# Patient Record
Sex: Male | Born: 1980 | Race: Black or African American | Hispanic: No | Marital: Married | Smoking: Current every day smoker
Health system: Southern US, Community
[De-identification: ages and names within clinical notes are randomized; demographics above are authoritative.]

---

## 2016-06-01 ENCOUNTER — Emergency Department (HOSPITAL_COMMUNITY)
Admission: EM | Admit: 2016-06-01 | Discharge: 2016-06-01 | Disposition: A | Discharge status: Home / Self Care | Payer: Self-pay | Attending: Emergency Medicine | Admitting: Emergency Medicine

## 2016-06-01 ENCOUNTER — Encounter (HOSPITAL_COMMUNITY): Payer: Self-pay

## 2016-06-01 DIAGNOSIS — R1030 Lower abdominal pain, unspecified: Secondary | ICD-10-CM | POA: Insufficient documentation

## 2016-06-01 LAB — COMPREHENSIVE METABOLIC PANEL
ALBUMIN: 4.5 g/dL (ref 3.5–5.0)
ALT: 30 U/L (ref 17–63)
AST: 29 U/L (ref 15–41)
Alkaline Phosphatase: 79 U/L (ref 38–126)
Anion gap: 8 (ref 5–15)
BILIRUBIN TOTAL: 0.6 mg/dL (ref 0.3–1.2)
BUN: 14 mg/dL (ref 6–20)
CO2: 25 mmol/L (ref 22–32)
Calcium: 9.5 mg/dL (ref 8.9–10.3)
Chloride: 105 mmol/L (ref 101–111)
Creatinine, Ser: 1.03 mg/dL (ref 0.61–1.24)
GFR calc Af Amer: >60 mL/min (ref >60)
GFR calc non Af Amer: >60 mL/min (ref >60)
GLUCOSE: 99 mg/dL (ref 65–99)
Potassium: 4.3 mmol/L (ref 3.5–5.1)
Sodium: 138 mmol/L (ref 135–145)
TOTAL PROTEIN: 7.4 g/dL (ref 6.5–8.1)

## 2016-06-01 LAB — URINALYSIS, ROUTINE W REFLEX MICROSCOPIC
BILIRUBIN URINE: NEGATIVE
Glucose, UA: NEGATIVE mg/dL
HGB URINE DIPSTICK: NEGATIVE
KETONES UR: NEGATIVE mg/dL
Leukocytes, UA: NEGATIVE
NITRITE: NEGATIVE
PH: 6 (ref 5.0–8.0)
Protein, ur: NEGATIVE mg/dL
Specific Gravity, Urine: 1.027 (ref 1.005–1.030)

## 2016-06-01 LAB — CBC
HEMATOCRIT: 44.5 % (ref 39.0–52.0)
Hemoglobin: 15.1 g/dL (ref 13.0–17.0)
MCH: 31 pg (ref 26.0–34.0)
MCHC: 33.9 g/dL (ref 30.0–36.0)
MCV: 91.4 fL (ref 78.0–100.0)
Platelets: 231 10*3/uL (ref 150–400)
RBC: 4.87 MIL/uL (ref 4.22–5.81)
RDW: 12.5 % (ref 11.5–15.5)
WBC: 6.2 10*3/uL (ref 4.0–10.5)

## 2016-06-01 LAB — LIPASE, BLOOD: Lipase: 21 U/L (ref 11–51)

## 2016-06-01 NOTE — ED Provider Notes (Signed)
[facility] Provider Note   CSN: [id number] Arrival date & time: 06/01/16  1303   By signing my name below, I, Clarisse Gouge, attest that this documentation has been prepared under the direction and in the presence of Vanetta Mulders, MD. Electronically signed, Clarisse Gouge, ED Scribe. 06/01/16. 5:11 PM.   History   Chief Complaint Chief Complaint  Patient presents with  . Abdominal Pain   The history is provided by the patient and medical records. No language interpreter was used.    Travis Garza is a 36 y.o. male with no documented pertinent PMHx, self transported via private vehicle to the Emergency Department with concern for intermittent, central abdominal pain since ~8 AM today. He states this episode lasted until ~1:15 PM today. He states this is consistent with h/o similar symptoms without known etiology. Pt notes associated nausea. Pt describes 2/10, intermittent, bilateral middle abdominal discomfort that improves over time without intervention. No other modifying factors noted. Pt denies vomiting, back pain, fevers, visual changes, chest pain, SOB, diarrhea, dysuria, leg swelling, hematuria, rash, headache or any other complaints at this time.    No past medical history on file.  There are no active problems to display for this patient.   No past surgical history on file.     Home Medications    Prior to Admission medications   Not on File    Family History No family history on file.  Social History Social History  Substance Use Topics  . Smoking status: Never Smoker  . Smokeless tobacco: Never Used  . Alcohol use Yes     Comment: occ     Allergies   Patient has no known allergies.   Review of Systems Review of Systems  Constitutional: Negative for chills and fever.  HENT: Negative for congestion, rhinorrhea and sore throat.   Eyes: Negative for visual disturbance.  Respiratory: Negative for cough and shortness of breath.     Cardiovascular: Negative for chest pain and leg swelling.  Gastrointestinal: Positive for abdominal pain and nausea. Negative for diarrhea and vomiting.  Genitourinary: Negative for dysuria and hematuria.  Musculoskeletal: Negative for back pain.  Skin: Negative for rash.  Neurological: Negative for headaches.  Hematological: Does not bruise/bleed easily.  Psychiatric/Behavioral: Negative for confusion.     Physical Exam Updated Vital Signs BP 128/72 (BP Location: Right Arm)   Pulse 70   Temp 99.1 F (37.3 C) (Oral)   Resp 19   SpO2 100%   Physical Exam  Constitutional: He appears well-developed and well-nourished. No distress.  HENT:  Head: Normocephalic.  Eyes: Conjunctivae and EOM are normal. Pupils are equal, round, and reactive to light. No scleral icterus.  Neck: Neck supple.  Cardiovascular: Normal rate and regular rhythm.   Pulmonary/Chest: Effort normal. No respiratory distress. He has no wheezes. He has no rales.  Abdominal: Soft. Bowel sounds are normal. There is no tenderness.  Musculoskeletal: Normal range of motion. He exhibits no edema.  Neurological: He is alert. No cranial nerve deficit or sensory deficit. He exhibits normal muscle tone. Coordination normal.  Skin: Skin is warm and dry.  Psychiatric: He has a normal mood and affect.  Nursing note and vitals reviewed.    ED Treatments / Results  DIAGNOSTIC STUDIES: Oxygen Saturation is 100% on RA, NL by my interpretation.    COORDINATION OF CARE: 5:10 PM Discussed treatment plan with pt at bedside and pt agreed to plan. Will order labs. Labs (all labs ordered are listed, but  only abnormal results are displayed) Labs Reviewed  LIPASE, BLOOD  COMPREHENSIVE METABOLIC PANEL  CBC  URINALYSIS, ROUTINE W REFLEX MICROSCOPIC    EKG  EKG Interpretation None       Radiology No results found.  Procedures Procedures (including critical care time)  Medications Ordered in ED Medications - No data  to display   Initial Impression / Assessment and Plan / ED Course  I have reviewed the triage vital signs and the nursing notes.  Pertinent labs & imaging results that were available during my care of the patient were reviewed by me and considered in my medical decision making (see chart for details).     Abdominal pain has resolved and not returned. Patient feels fine now. Labs without sniffing abnormalities. Urinalysis negative. No reason for her CT imaging at this time. Patient will return for recurrent abdominal pain lasting an hour or longer. Patient stable for discharge home.  Final Clinical Impressions(s) / ED Diagnoses   Final diagnoses:  Lower abdominal pain    New Prescriptions New Prescriptions   No medications on file    I personally performed the services described in this documentation, which was scribed in my presence. The recorded information has been reviewed and is accurate.      Vanetta Mulders, MD 06/01/16 1734

## 2016-06-01 NOTE — ED Notes (Signed)
ED Provider at bedside. 

## 2016-06-01 NOTE — Discharge Instructions (Signed)
Workup for the abdominal pain without any significant findings. Labs all normal. Return for recurrent pain that lasts for more than an hour. Work note provided.

## 2016-06-01 NOTE — ED Triage Notes (Signed)
Pt states he had abdominal pain at work with some nausea. Denies vomiting. Pt reports the pain is intermittent in nature.

## 2016-10-16 ENCOUNTER — Encounter (HOSPITAL_COMMUNITY): Payer: Self-pay | Admitting: *Deleted

## 2016-10-16 ENCOUNTER — Ambulatory Visit (HOSPITAL_COMMUNITY)
Admission: EM | Admit: 2016-10-16 | Discharge: 2016-10-16 | Disposition: A | Discharge status: Home / Self Care | Payer: Self-pay | Attending: Family Medicine | Admitting: Family Medicine

## 2016-10-16 DIAGNOSIS — R059 Cough, unspecified: Secondary | ICD-10-CM

## 2016-10-16 DIAGNOSIS — J40 Bronchitis, not specified as acute or chronic: Secondary | ICD-10-CM

## 2016-10-16 DIAGNOSIS — R05 Cough: Secondary | ICD-10-CM

## 2016-10-16 MED ORDER — BENZONATATE 100 MG PO CAPS: 200.0000 mg | ORAL_CAPSULE | Freq: Three times a day (TID) | ORAL | 0 refills | Status: DC | PRN

## 2016-10-16 MED ORDER — IPRATROPIUM BROMIDE 0.06 % NA SOLN: 2.0000 | Freq: Four times a day (QID) | NASAL | 0 refills | Status: DC

## 2016-10-16 MED ORDER — AZITHROMYCIN 250 MG PO TABS: 250.0000 mg | ORAL_TABLET | Freq: Every day | ORAL | 0 refills | Status: DC

## 2016-10-16 NOTE — ED Provider Notes (Signed)
  [facility]   [id number] 10/16/16 Arrival Time: 1735  ASSESSMENT & PLAN:  1. Bronchitis   2. Cough     Meds ordered this encounter  Medications  . azithromycin (ZITHROMAX) 250 MG tablet    Sig: Take 1 tablet (250 mg total) by mouth daily. Take first 2 tablets together, then 1 every day until finished.    Dispense:  6 tablet    Refill:  0    Order Specific Question:   Supervising Provider    Answer:   Mardella LaymanHAGLER, BRIAN [id number]]  . benzonatate (TESSALON) 100 MG capsule    Sig: Take 2 capsules (200 mg total) by mouth 3 (three) times daily as needed for cough.    Dispense:  21 capsule    Refill:  0    Order Specific Question:   Supervising Provider    Answer:   Mardella LaymanHAGLER, BRIAN [id number]]  . ipratropium (ATROVENT) 0.06 % nasal spray    Sig: Place 2 sprays into both nostrils 4 (four) times daily.    Dispense:  15 mL    Refill:  0    Order Specific Question:   Supervising Provider    Answer:   Mardella LaymanHAGLER, BRIAN [[id number]]    Reviewed expectations re: course of current medical issues. Questions answered. Outlined signs and symptoms indicating need for more acute intervention. Patient verbalized understanding. After Visit Summary given.   SUBJECTIVE:  Travis Garza is a 36 y.o. male who presents with complaint of nasal congestion and cough for last few days.  He is a smoker.  ROS: As per HPI.   OBJECTIVE:  Vitals:   10/16/16 1816  BP: 129/85  Pulse: 77  Resp: 17  Temp: 98.5 F (36.9 C)  TempSrc: Oral  SpO2: 100%    General appearance: alert; no distress Eyes: PERRLA; EOMI; conjunctiva normal HENT: normocephalic; atraumatic; TMs normal; nasal mucosa normal; oral mucosa normal Neck: supple Lungs: clear to auscultation bilaterally Heart: regular rate and rhythm Abdomen: soft, non-tender; bowel sounds normal; no masses or organomegaly; no guarding or rebound tenderness Back: no CVA tenderness Extremities: no cyanosis or edema; symmetrical with no gross  deformities Skin: warm and dry Neurologic: normal gait; normal symmetric reflexes Psychological: alert and cooperative; normal mood and affect  Labs:  Labs Reviewed - No data to display  Imaging: No results found.  No Known Allergies  History reviewed. No pertinent past medical history. Social History   Social History  . Marital status: Married    Spouse name: N/A  . Number of children: N/A  . Years of education: N/A   Occupational History  . Not on file.   Social History Main Topics  . Smoking status: Current Every Day Smoker  . Smokeless tobacco: Never Used  . Alcohol use Yes     Comment: occ  . Drug use: Unknown  . Sexual activity: Not on file   Other Topics Concern  . Not on file   Social History Narrative  . No narrative on file   History reviewed. No pertinent family history. History reviewed. No pertinent surgical history.   Deatra CanterOxford, William J, [place]FNP 10/16/16 1843

## 2016-10-16 NOTE — ED Triage Notes (Signed)
Patient reports nasal drainage and cough since yesterday. No fever.

## 2016-10-24 ENCOUNTER — Encounter (HOSPITAL_COMMUNITY): Payer: Self-pay | Admitting: Emergency Medicine

## 2016-10-24 ENCOUNTER — Ambulatory Visit (HOSPITAL_COMMUNITY)
Admission: EM | Admit: 2016-10-24 | Discharge: 2016-10-24 | Disposition: A | Discharge status: Home / Self Care | Payer: Self-pay | Attending: Family Medicine | Admitting: Family Medicine

## 2016-10-24 DIAGNOSIS — M791 Myalgia, unspecified site: Secondary | ICD-10-CM

## 2016-10-24 MED ORDER — IBUPROFEN 800 MG PO TABS: 800.0000 mg | ORAL_TABLET | Freq: Three times a day (TID) | ORAL | 0 refills | Status: DC

## 2016-10-24 NOTE — ED Triage Notes (Signed)
Pt c/o right side sharp should pain onset about a week ago. Pt denies injury but he does do some heavy lifting for his job. Pt also states he goes to the gym and lifts weights.

## 2016-10-24 NOTE — ED Provider Notes (Signed)
[facility]    CSN: [id number] Arrival date & time: 10/24/16  1602     History   Chief Complaint Chief Complaint  Patient presents with  . Shoulder Pain    HPI Travis Garza is a 36 y.o. male.   Presents today for sharp pain to right shoulder area for 1 week, not getting worse or better. Pain is constant. Pain does not radiate.  No aggravating or alleviating factors. Have not tried anything to help at home. No numbness or tingling sensation. Denies neck pain. Has no known injury. He does lift heavy object at work.          History reviewed. No pertinent past medical history.  There are no active problems to display for this patient.   History reviewed. No pertinent surgical history.     Home Medications    Prior to Admission medications   Medication Sig Start Date End Date Taking? Authorizing Provider  azithromycin (ZITHROMAX) 250 MG tablet Take 1 tablet (250 mg total) by mouth daily. Take first 2 tablets together, then 1 every day until finished. 10/16/16   Deatra Canter, FNP  benzonatate (TESSALON) 100 MG capsule Take 2 capsules (200 mg total) by mouth 3 (three) times daily as needed for cough. 10/16/16   Deatra Canter, FNP  ibuprofen (ADVIL,MOTRIN) 800 MG tablet Take 1 tablet (800 mg total) by mouth 3 (three) times daily. 10/24/16   Lucia Estelle, NP  ipratropium (ATROVENT) 0.06 % nasal spray Place 2 sprays into both nostrils 4 (four) times daily. 10/16/16   Deatra Canter, FNP    Family History History reviewed. No pertinent family history.  Social History Social History  Substance Use Topics  . Smoking status: Current Every Day Smoker    Packs/day: 1.00  . Smokeless tobacco: Never Used  . Alcohol use Yes     Comment: occ     Allergies   Patient has no known allergies.   Review of Systems Review of Systems  Constitutional:       See HPI      Physical Exam Triage Vital Signs ED Triage Vitals  Enc Vitals Group     BP --       Pulse Rate 10/24/16 1640 72     Resp 10/24/16 1640 16     Temp 10/24/16 1640 98.9 F (37.2 C)     Temp Source 10/24/16 1640 Oral     SpO2 10/24/16 1640 97 %     Weight --      Height --      Head Circumference --      Peak Flow --      Pain Score 10/24/16 1642 5     Pain Loc --      Pain Edu? --      Excl. in GC? --    No data found.   Updated Vital Signs Pulse 72   Temp 98.9 F (37.2 C) (Oral)   Resp 16   SpO2 97%   Visual Acuity Right Eye Distance:   Left Eye Distance:   Bilateral Distance:    Right Eye Near:   Left Eye Near:    Bilateral Near:     Physical Exam  Constitutional: He is oriented to person, place, and time. He appears well-developed and well-nourished.  HENT:  Head: Normocephalic and atraumatic.  Cardiovascular: Normal rate, regular rhythm and normal heart sounds.   Pulmonary/Chest: Effort normal and breath sounds normal. He has no wheezes.  Musculoskeletal: Normal range of motion. He exhibits tenderness (Tender on deep palpation over right shoulder area. ). He exhibits no edema or deformity.       Arms: Neurological: He is alert and oriented to person, place, and time.  Nursing note and vitals reviewed.    UC Treatments / Results  Labs (all labs ordered are listed, but only abnormal results are displayed) Labs Reviewed - No data to display  EKG  EKG Interpretation None       Radiology No results found.  Procedures Procedures (including critical care time)  Medications Ordered in UC Medications - No data to display   Initial Impression / Assessment and Plan / UC Course  I have reviewed the triage vital signs and the nursing notes.  Pertinent labs & imaging results that were available during my care of the patient were reviewed by me and considered in my medical decision making (see chart for details).     Final Clinical Impressions(s) / UC Diagnoses   Final diagnoses:  Muscle pain   1) Start NSAID TID PRN 2) Start  Apply heat to the area 3) May try to massage the area 4) Rest the area if possible 5) Work note given for today.   New Prescriptions New Prescriptions   IBUPROFEN (ADVIL,MOTRIN) 800 MG TABLET    Take 1 tablet (800 mg total) by mouth 3 (three) times daily.     Controlled Substance Prescriptions [place] Controlled Substance Registry consulted? Not Applicable   Lucia EstelleZheng, Milton Streicher, NP 10/24/16 [phone]

## 2017-10-04 ENCOUNTER — Emergency Department (HOSPITAL_COMMUNITY): Payer: Self-pay

## 2017-10-04 ENCOUNTER — Emergency Department (HOSPITAL_COMMUNITY)
Admission: EM | Admit: 2017-10-04 | Discharge: 2017-10-04 | Disposition: A | Discharge status: Home / Self Care | Payer: Self-pay | Attending: Emergency Medicine | Admitting: Emergency Medicine

## 2017-10-04 ENCOUNTER — Encounter (HOSPITAL_COMMUNITY): Payer: Self-pay | Admitting: Emergency Medicine

## 2017-10-04 ENCOUNTER — Other Ambulatory Visit: Payer: Self-pay

## 2017-10-04 DIAGNOSIS — M25511 Pain in right shoulder: Secondary | ICD-10-CM | POA: Insufficient documentation

## 2017-10-04 DIAGNOSIS — F17200 Nicotine dependence, unspecified, uncomplicated: Secondary | ICD-10-CM | POA: Insufficient documentation

## 2017-10-04 MED ORDER — METHOCARBAMOL 500 MG PO TABS: 500.0000 mg | ORAL_TABLET | Freq: Three times a day (TID) | ORAL | 0 refills | Status: DC | PRN

## 2017-10-04 MED ORDER — NAPROXEN 500 MG PO TABS: 500.0000 mg | ORAL_TABLET | Freq: Two times a day (BID) | ORAL | 0 refills | Status: DC

## 2017-10-04 NOTE — Discharge Instructions (Addendum)
Please read and follow all provided instructions.  You have been seen today for right shoulder pain  Tests performed today include: An x-ray of the affected area - does NOT show any broken bones or dislocations.  Vital signs. See below for your results today.   Medications:  - Naproxen is a nonsteroidal anti-inflammatory medication that will help with pain and swelling. Be sure to take this medication as prescribed with food, 1 pill every 12 hours,  It should be taken with food, as it can cause stomach upset, and more seriously, stomach bleeding. Do not take other nonsteroidal anti-inflammatory medications with this such as Advil, Motrin, Aleve, Mobic, Goodie Powder, or Motrin.    - Robaxin is the muscle relaxer I have prescribed, this is meant to help with muscle tightness. Be aware that this medication may make you drowsy therefore the first time you take this it should be at a time you are in an environment where you can rest. Do not drive or operate heavy machinery when taking this medication. Do not drink alcohol or take other sedating medications with this medicine such as narcotics or benzodiazepines.   You make take Tylenol per over the counter dosing with these medications.   We have prescribed you new medication(s) today. Discuss the medications prescribed today with your pharmacist as they can have adverse effects and interactions with your other medicines including over the counter and prescribed medications. Seek medical evaluation if you start to experience new or abnormal symptoms after taking one of these medicines, seek care immediately if you start to experience difficulty breathing, feeling of your throat closing, facial swelling, or rash as these could be indications of a more serious allergic reaction  Follow-up instructions: Please follow-up with your primary care provider or the provided orthopedic physician (bone specialist) if you continue to have significant pain in 1  week. In this case you may have a more severe injury that requires further care.   Return instructions:  Please return if your digits or extremity are numb or tingling, appear gray or blue, or you have severe pain (also elevate the extremity and loosen splint or wrap if you were given one) Please return if you have redness or fevers.  Please return to the Emergency Department if you experience worsening symptoms.  Please return if you have any other emergent concerns. Additional Information:  Your vital signs today were: BP (!) 124/99 (BP Location: Left Arm)    Pulse 74    Temp 98 F (36.7 C) (Oral)    Resp 18    SpO2 97%  If your blood pressure (BP) was elevated above 135/85 this visit, please have this repeated by your doctor within one month. ---------------

## 2017-10-04 NOTE — ED Triage Notes (Signed)
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 VSS. Pt in NAD.

## 2017-10-04 NOTE — ED Provider Notes (Signed)
[facility]   CSN: [id number] Arrival date & time: 10/04/17  0750     History   Chief Complaint Chief Complaint  Patient presents with  . Shoulder Pain    HPI Travis Garza is a 37 y.o. male with a hx of tobacco abuse who presents to the ED with complaints of R shoulder pain that started 2 days ago. 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 Pain is mostly in the posterior shoulder, described as sharp, an 8/10 in severity, worse with movement. No alleviating factors, no interventions PTA. Denies fever, chills, numbness, weakness, paresthesias, neck pain, chest pain, or dyspnea.   HPI  History reviewed. No pertinent past medical history.  There are no active problems to display for this patient.   History reviewed. No pertinent surgical history.      Home Medications    Prior to Admission medications   Medication Sig Start Date End Date Taking? Authorizing Provider  azithromycin (ZITHROMAX) 250 MG tablet Take 1 tablet (250 mg total) by mouth daily. Take first 2 tablets together, then 1 every day until finished. 10/16/16   Deatra Canterxford, William J, FNP  benzonatate (TESSALON) 100 MG capsule Take 2 capsules (200 mg total) by mouth 3 (three) times daily as needed for cough. 10/16/16   Deatra Canterxford, William J, FNP  ibuprofen (ADVIL,MOTRIN) 800 MG tablet Take 1 tablet (800 mg total) by mouth 3 (three) times daily. 10/24/16   Lucia EstelleZheng, Feng, NP  ipratropium (ATROVENT) 0.06 % nasal spray Place 2 sprays into both nostrils 4 (four) times daily. 10/16/16   Deatra Canterxford, William J, FNP    Family History History reviewed. No pertinent family history.  Social History Social History   Tobacco Use  . Smoking status: Current Every Day Smoker    Packs/day: 1.00  . Smokeless tobacco: Never Used  Substance Use Topics  . Alcohol use:  Yes    Comment: occ  . Drug use: Not on file     Allergies   Patient has no known allergies.   Review of Systems Review of Systems  Constitutional: Negative for chills and fever.  Musculoskeletal: Positive for arthralgias (R shoulder). Negative for neck pain.  Neurological: Negative for weakness and numbness.     Physical Exam Updated Vital Signs BP (!) 124/99 (BP Location: Left Arm)   Pulse 74   Temp 98 F (36.7 C) (Oral)   Resp 18   SpO2 97%   Physical Exam  Constitutional: He appears well-developed and well-nourished. No distress.  HENT:  Head: Normocephalic and atraumatic.  Eyes: Conjunctivae are normal. Right eye exhibits no discharge. Left eye exhibits no discharge.  Neck: Normal range of motion. No spinous process tenderness present.  Cardiovascular:  2+ symmetric radial pulses  Musculoskeletal:  No obvious deformity, appreciable swelling, erythema, warmth, open wounds, or ecchymosis.  Upper extremities: Patient has full ROM to bilateral shoulders, elbows, and wrists. He has some discomfort with RUE shoulder flexion, but is able to perform. He is tender over the supraspinatus and trapezius on the right. Palpable muscle spasm. No point/focal bony tenderness or palpable joint instability. Empty can test with good strength.   Neurological: He is alert.  Clear speech. Sensation grossly intact to bilateral upper extremities. 5/5 symmetric grip strength. Able to perform OK sign, thumbs up, and cross 2nd/3rd digits bilaterally.  Psychiatric: He has a normal mood and affect. His behavior is normal. Thought content normal.  Nursing note and vitals reviewed.    ED Treatments / Results  Labs (all labs ordered are listed, but only abnormal results are displayed) Labs Reviewed - No data to display  EKG None  Radiology Dg Shoulder Right  Result Date: 10/04/2017 CLINICAL DATA:  Sharp right shoulder pain with painful range of motion. EXAM: RIGHT SHOULDER - 2+ VIEW  COMPARISON:  None. FINDINGS: There is no evidence of fracture or dislocation. There is no evidence of arthropathy or other focal bone abnormality. Soft tissues are unremarkable. IMPRESSION: Negative. Electronically Signed   By: Francene BoyersJames  Maxwell M.D.   On: 10/04/2017 08:41    Procedures Procedures (including critical care time)  Medications Ordered in ED Medications - No data to display   Initial Impression / Assessment and Plan / ED Course  I have reviewed the triage vital signs and the nursing notes.  Pertinent labs & imaging results that were available during my care of the patient were reviewed by me and considered in my medical decision making (see chart for details).   Patient presents to the ED with complaints of R shoulder pain s/p operating machinery/heavy lifting at work. Patient nontoxic appearing, in no apparent distress, vitals WNL with the exception of elevated diastolic BP- doubt HTN emergency, patient aware of need for recheck. X-ray negative for fracture/dislocation. No fevers, erythema, or warmth to raise concern for infectious etiology such as septic joint. Pain reproducible with palpable muscle spasm in the R trapezius/supraspinatus area. NVI distally. Will treat with naproxen/robaxin- instructed patient not to drive/operate heavy machinery when taking robaxin. I discussed results, treatment plan, need for PCP/ortho follow-up, and return precautions with the patient. Provided opportunity for questions, patient confirmed understanding and is in agreement with plan.    Final Clinical Impressions(s) / ED Diagnoses   Final diagnoses:  Acute pain of right shoulder    ED Discharge Orders         Ordered    naproxen (NAPROSYN) 500 MG tablet  2 times daily     10/04/17 0903    methocarbamol (ROBAXIN) 500 MG tablet  Every 8 hours PRN     10/04/17 0903           Cherly Andersonetrucelli, Harmon Bommarito R, PA-C 10/04/17 0905    Wynetta FinesMessick, Peter C, MD 10/05/17 2021

## 2018-05-20 ENCOUNTER — Other Ambulatory Visit: Payer: Self-pay

## 2018-05-20 ENCOUNTER — Emergency Department (HOSPITAL_COMMUNITY)
Admission: EM | Admit: 2018-05-20 | Discharge: 2018-05-20 | Disposition: A | Discharge status: Home / Self Care | Payer: Self-pay | Attending: Emergency Medicine | Admitting: Emergency Medicine

## 2018-05-20 ENCOUNTER — Encounter (HOSPITAL_COMMUNITY): Payer: Self-pay | Admitting: Emergency Medicine

## 2018-05-20 DIAGNOSIS — F17200 Nicotine dependence, unspecified, uncomplicated: Secondary | ICD-10-CM | POA: Insufficient documentation

## 2018-05-20 DIAGNOSIS — K0889 Other specified disorders of teeth and supporting structures: Secondary | ICD-10-CM

## 2018-05-20 DIAGNOSIS — K029 Dental caries, unspecified: Secondary | ICD-10-CM | POA: Insufficient documentation

## 2018-05-20 DIAGNOSIS — Z79899 Other long term (current) drug therapy: Secondary | ICD-10-CM | POA: Insufficient documentation

## 2018-05-20 MED ORDER — PENICILLIN V POTASSIUM 500 MG PO TABS: 500.0000 mg | ORAL_TABLET | Freq: Four times a day (QID) | ORAL | 0 refills | Status: AC

## 2018-05-20 NOTE — Discharge Instructions (Addendum)
Take penicillin as prescribed and complete the full course. Take 2 Advil liquid gels with 1 extra strength Tylenol every 6 hours for pain. Rinse with Listerine after every meal. See a dentist as soon as possible.

## 2018-05-20 NOTE — ED Provider Notes (Signed)
[facility] EMERGENCY DEPARTMENT Provider Note   CSN: [id number] Arrival date & time: 05/20/18  1027    History   Chief Complaint Chief Complaint  Patient presents with  . Dental Pain    HPI JAMESMATTHEW RAPPOLD is a 38 y.o. male.     38 year old male presents with complaint of right upper central incisor pain x3 days without trauma, fever, drainage.  States the tooth is falling out.  Patient is not taking anything for his pain.  Reports mild facial swelling.  No other complaints or concerns.  BRANSON HANNULA was evaluated in Emergency Department on 05/20/2018 for the symptoms described in the history of present illness. He was evaluated in the context of the global COVID-19 pandemic, which necessitated consideration that the patient might be at risk for infection with the SARS-CoV-2 virus that causes COVID-19. Institutional protocols and algorithms that pertain to the evaluation of patients at risk for COVID-19 are in a state of rapid change based on information released by regulatory bodies including the CDC and federal and state organizations. These policies and algorithms were followed during the patient's care in the ED.      History reviewed. No pertinent past medical history.  There are no active problems to display for this patient.   History reviewed. No pertinent surgical history.      Home Medications    Prior to Admission medications   Medication Sig Start Date End Date Taking? Authorizing Provider  ipratropium (ATROVENT) 0.06 % nasal spray Place 2 sprays into both nostrils 4 (four) times daily. 10/16/16   Deatra Canter, FNP  methocarbamol (ROBAXIN) 500 MG tablet Take 1 tablet (500 mg total) by mouth every 8 (eight) hours as needed. 10/04/17   Petrucelli, Samantha R, PA-C  penicillin v potassium (VEETID) 500 MG tablet Take 1 tablet (500 mg total) by mouth 4 (four) times daily for 7 days. 05/20/18 05/27/18  Jeannie Fend, PA-C    Family History  No family history on file.  Social History Social History   Tobacco Use  . Smoking status: Current Every Day Smoker    Packs/day: 1.00  . Smokeless tobacco: Never Used  Substance Use Topics  . Alcohol use: Yes    Comment: occ  . Drug use: Never     Allergies   Patient has no known allergies.   Review of Systems Review of Systems  Constitutional: Negative for fever.  HENT: Positive for dental problem and facial swelling. Negative for ear pain, trouble swallowing and voice change.   Gastrointestinal: Negative for nausea and vomiting.  Musculoskeletal: Negative for neck pain.  Skin: Negative for rash and wound.  Allergic/Immunologic: Negative for immunocompromised state.  Neurological: Negative for headaches.  Hematological: Negative for adenopathy.  Psychiatric/Behavioral: Negative for confusion.  All other systems reviewed and are negative.    Physical Exam Updated Vital Signs BP (!) 134/96 (BP Location: Right Arm)   Pulse 77   Temp 98.5 F (36.9 C) (Oral)   Resp 16   Ht 6\' 1"  (1.854 m)   Wt 102.1 kg   SpO2 100%   BMI 29.69 kg/m   Physical Exam Vitals signs and nursing note reviewed.  Constitutional:      General: He is not in acute distress.    Appearance: He is well-developed. He is not diaphoretic.  HENT:     Head: Normocephalic and atraumatic.     Jaw: No trismus.     Mouth/Throat:     Mouth:  Mucous membranes are moist.   Neck:     Musculoskeletal: Neck supple.  Pulmonary:     Effort: Pulmonary effort is normal.  Lymphadenopathy:     Cervical: No cervical adenopathy.  Neurological:     Mental Status: He is alert and oriented to person, place, and time.  Psychiatric:        Behavior: Behavior normal.      ED Treatments / Results  Labs (all labs ordered are listed, but only abnormal results are displayed) Labs Reviewed - No data to display  EKG None  Radiology No results found.  Procedures Procedures (including critical care time)   Medications Ordered in ED Medications - No data to display   Initial Impression / Assessment and Plan / ED Course  I have reviewed the triage vital signs and the nursing notes.  Pertinent labs & imaging results that were available during my care of the patient were reviewed by me and considered in my medical decision making (see chart for details).  Clinical Course as of May 20 854  Fri May 20, 2018  4128 38 year old male with right upper dental pain with decay without trauma, fever, drainage.  Patient given prescription for penicillin, advised to take Motrin Tylenol and follow-up with a dentist as soon as possible.   [LM]    Clinical Course User Index [LM] Jeannie Fend, PA-C      Final Clinical Impressions(s) / ED Diagnoses   Final diagnoses:  Pain, dental  Dental caries    ED Discharge Orders         Ordered    penicillin v potassium (VEETID) 500 MG tablet  4 times daily     05/20/18 0854           Jeannie Fend, PA-C 05/20/18 3009    Margarita Grizzle, MD 05/27/18 [phone]

## 2018-05-20 NOTE — ED Triage Notes (Addendum)
Pt reports a toothache that has been going for the past 3 days. Pt denies attempting the usage of OTC meds. Reports coming to the ER was an easier option.

## 2018-07-29 ENCOUNTER — Encounter (HOSPITAL_COMMUNITY): Payer: Self-pay | Admitting: Emergency Medicine

## 2018-07-29 ENCOUNTER — Other Ambulatory Visit: Payer: Self-pay

## 2018-07-29 ENCOUNTER — Ambulatory Visit (HOSPITAL_COMMUNITY)
Admission: EM | Admit: 2018-07-29 | Discharge: 2018-07-29 | Disposition: A | Discharge status: Home / Self Care | Payer: Self-pay | Attending: Family Medicine | Admitting: Family Medicine

## 2018-07-29 DIAGNOSIS — G44209 Tension-type headache, unspecified, not intractable: Secondary | ICD-10-CM

## 2018-07-29 MED ORDER — KETOROLAC TROMETHAMINE 30 MG/ML IJ SOLN
30.0000 mg | Freq: Once | INTRAMUSCULAR | Status: AC
  Administered 2018-07-29: 13:00:00 30 mg via INTRAMUSCULAR

## 2018-07-29 MED ORDER — KETOROLAC TROMETHAMINE 30 MG/ML IJ SOLN
INTRAMUSCULAR | Status: AC
  Filled 2018-07-29: qty 1

## 2018-07-29 NOTE — ED Triage Notes (Signed)
Pt here for left sided HA starting this am

## 2018-07-29 NOTE — ED Provider Notes (Signed)
[place]    CSN: [id number] Arrival date & time: 07/29/18  1210     History   Chief Complaint Chief Complaint  Patient presents with  . Headache    HPI Travis Garza is a 38 y.o. male.   Patient is a 38 year old male who presents today with left temporal headache.  This woke him up at 2 AM.  Since his pain has decreased.  He has not taken anything for the pain.  Nothing makes the pain worse or better.  Denies any history of migraines or headaches.  Denies any associated photophobia, phonophobia, nausea, vomiting, blurred vision, dizziness, or weakness.  Denies any head injuries or sinus issues.  ROS per HPI      History reviewed. No pertinent past medical history.  There are no active problems to display for this patient.   History reviewed. No pertinent surgical history.     Home Medications    Prior to Admission medications   Medication Sig Start Date End Date Taking? Authorizing Provider  ipratropium (ATROVENT) 0.06 % nasal spray Place 2 sprays into both nostrils 4 (four) times daily. Patient not taking: Reported on 07/29/2018 10/16/16 07/29/18  Lysbeth Penner, FNP    Family History Family History  Problem Relation Age of Onset  . Healthy Mother   . Healthy Father     Social History Social History   Tobacco Use  . Smoking status: Current Every Day Smoker    Packs/day: 1.00  . Smokeless tobacco: Never Used  Substance Use Topics  . Alcohol use: Yes    Comment: occ  . Drug use: Never     Allergies   Patient has no known allergies.   Review of Systems Review of Systems   Physical Exam Triage Vital Signs ED Triage Vitals [07/29/18 1226]  Enc Vitals Group     BP (!) 152/92     Pulse Rate 82     Resp 18     Temp 98.6 F (37 C)     Temp Source Oral     SpO2 98 %     Weight      Height      Head Circumference      Peak Flow      Pain Score 8     Pain Loc      Pain Edu?      Excl. in [place]?    No data found.   Updated Vital Signs BP (!) 152/92 (BP Location: Right Arm)   Pulse 82   Temp 98.6 F (37 C) (Oral)   Resp 18   SpO2 98%   Visual Acuity Right Eye Distance:   Left Eye Distance:   Bilateral Distance:    Right Eye Near:   Left Eye Near:    Bilateral Near:     Physical Exam Vitals signs and nursing note reviewed.  Constitutional:      General: He is not in acute distress.    Appearance: He is well-developed. He is not ill-appearing, toxic-appearing or diaphoretic.  HENT:     Head: Normocephalic and atraumatic.     Mouth/Throat:     Mouth: Mucous membranes are moist.     Pharynx: Oropharynx is clear.  Eyes:     General: No visual field deficit.    Extraocular Movements: Extraocular movements intact.     Pupils: Pupils are equal, round, and reactive to light.  Neck:     Musculoskeletal: Normal range of motion and neck  supple.  Pulmonary:     Effort: Pulmonary effort is normal.  Musculoskeletal: Normal range of motion.  Skin:    General: Skin is warm and dry.  Neurological:     Mental Status: He is alert.     Cranial Nerves: No cranial nerve deficit, dysarthria or facial asymmetry.     Sensory: No sensory deficit.     Motor: No weakness.     Coordination: Coordination normal.     Gait: Gait normal.  Psychiatric:        Mood and Affect: Mood normal.        Speech: Speech normal.        Behavior: Behavior normal.      UC Treatments / Results  Labs (all labs ordered are listed, but only abnormal results are displayed) Labs Reviewed - No data to display  EKG None  Radiology No results found.  Procedures Procedures (including critical care time)  Medications Ordered in UC Medications  ketorolac (TORADOL) 30 MG/ML injection 30 mg (30 mg Intramuscular Given 07/29/18 1327)  ketorolac (TORADOL) 30 MG/ML injection (has no administration in time range)    Initial Impression / Assessment and Plan / UC Course  I have reviewed the triage vital signs and the  nursing notes.  Pertinent labs & imaging results that were available during my care of the patient were reviewed by me and considered in my medical decision making (see chart for details).     No neurological deficits on exam. Cranial nerves grossly intact and strength 5 out of 5 in all extremities. No focal deficit. The headache has improved since this morning without intervention. Most likely tension headache Will treat with Toradol here in clinic and have him follow-up for continued worsening headache or any other concerning symptoms. Work not given as requested.  Final Clinical Impressions(s) / UC Diagnoses   Final diagnoses:  Tension headache     Discharge Instructions     No concerning signs or symptoms on exam.  I believe this is just a simple tension headache.  Given Toradol here for pain If you start developing any worsening symptoms she will need to go to the ER.    ED Prescriptions    None     Controlled Substance Prescriptions [place] Controlled Substance Registry consulted? Not Applicable   Janace ArisBast, Madine Sarr A, NP 07/29/18 1341

## 2018-07-29 NOTE — Discharge Instructions (Addendum)
No concerning signs or symptoms on exam.  I believe this is just a simple tension headache.  Given Toradol here for pain If you start developing any worsening symptoms she will need to go to the ER.

## 2018-08-10 ENCOUNTER — Encounter (HOSPITAL_COMMUNITY): Payer: Self-pay | Admitting: Emergency Medicine

## 2018-08-10 ENCOUNTER — Ambulatory Visit (HOSPITAL_COMMUNITY)
Admission: EM | Admit: 2018-08-10 | Discharge: 2018-08-10 | Disposition: A | Discharge status: Home / Self Care | Payer: Self-pay | Attending: Family Medicine | Admitting: Family Medicine

## 2018-08-10 ENCOUNTER — Other Ambulatory Visit: Payer: Self-pay

## 2018-08-10 DIAGNOSIS — K0889 Other specified disorders of teeth and supporting structures: Secondary | ICD-10-CM

## 2018-08-10 MED ORDER — PENICILLIN V POTASSIUM 500 MG PO TABS: 500.0000 mg | ORAL_TABLET | Freq: Three times a day (TID) | ORAL | 0 refills | Status: DC

## 2018-08-10 MED ORDER — IBUPROFEN 800 MG PO TABS: 800.0000 mg | ORAL_TABLET | Freq: Three times a day (TID) | ORAL | 0 refills | Status: DC

## 2018-08-10 NOTE — ED Triage Notes (Signed)
Top tooth pain for 3 days

## 2018-08-11 NOTE — ED Provider Notes (Signed)
[place]   [id number] 08/10/18 Arrival Time: 6222  ASSESSMENT & PLAN:  1. Pain, dental    No sign of abscess requiring I&D at this time. Discussed.  Meds ordered this encounter  Medications  . penicillin v potassium (VEETID) 500 MG tablet    Sig: Take 1 tablet (500 mg total) by mouth 3 (three) times daily.    Dispense:  30 tablet    Refill:  0  . ibuprofen (ADVIL) 800 MG tablet    Sig: Take 1 tablet (800 mg total) by mouth 3 (three) times daily with meals.    Dispense:  21 tablet    Refill:  0   Dental resource written instructions given. He will schedule dental evaluation as soon as possible if not improving over the next 24-48 hours.  Reviewed expectations re: course of current medical issues. Questions answered. Outlined signs and symptoms indicating need for more acute intervention. Patient verbalized understanding. After Visit Summary given.   SUBJECTIVE:  Travis Garza is a 38 y.o. male who reports gradual onset of R upper frontal dental pain described as aching/throbbing. H/O similar in same location. Present for several days. Fever: absent. Tolerating PO intake but reports pain with chewing. Normal swallowing. He does not see a dentist regularly. No neck swelling or pain. OTC analgesics without relief.  ROS: As per HPI.  OBJECTIVE: Vitals:   08/10/18 1500  BP: (!) 119/92  Pulse: 93  Resp: 16  Temp: 98.4 F (36.9 C)  TempSrc: Oral  SpO2: 96%    General appearance: alert; no distress HENT: normocephalic; atraumatic; dentition: fair; R upper frontal gums without areas of fluctuance, drainage, or bleeding and with tenderness to palpation; normal jaw movement without difficulty Neck: supple without LAD; FROM; trachea midline Lungs: normal respirations; unlabored Skin: warm and dry Psychological: alert and cooperative; normal mood and affect  No Known Allergies   Social History   Socioeconomic History  . Marital status: Married   Spouse name: Not on file  . Number of children: Not on file  . Years of education: Not on file  . Highest education level: Not on file  Occupational History  . Not on file  Social Needs  . Financial resource strain: Not on file  . Food insecurity    Worry: Not on file    Inability: Not on file  . Transportation needs    Medical: Not on file    Non-medical: Not on file  Tobacco Use  . Smoking status: Current Every Day Smoker    Packs/day: 1.00  . Smokeless tobacco: Never Used  Substance and Sexual Activity  . Alcohol use: Yes    Comment: occ  . Drug use: Never  . Sexual activity: Not Currently  Lifestyle  . Physical activity    Days per week: Not on file    Minutes per session: Not on file  . Stress: Not on file  Relationships  . Social Herbalist on phone: Not on file    Gets together: Not on file    Attends religious service: Not on file    Active member of club or organization: Not on file    Attends meetings of clubs or organizations: Not on file    Relationship status: Not on file  . Intimate partner violence    Fear of current or ex partner: Not on file    Emotionally abused: Not on file    Physically abused: Not on file  Forced sexual activity: Not on file  Other Topics Concern  . Not on file  Social History Narrative  . Not on file   Family History  Problem Relation Age of Onset  . Healthy Mother   . Healthy Father    History reviewed. No pertinent surgical history.   Mardella LaymanHagler, Wadell Craddock, MD 08/11/18 [phone]

## 2019-05-22 ENCOUNTER — Ambulatory Visit (HOSPITAL_COMMUNITY)
Admission: EM | Admit: 2019-05-22 | Discharge: 2019-05-22 | Disposition: A | Discharge status: Home / Self Care | Payer: Self-pay | Attending: Internal Medicine | Admitting: Internal Medicine

## 2019-05-22 ENCOUNTER — Encounter (HOSPITAL_COMMUNITY): Payer: Self-pay

## 2019-05-22 ENCOUNTER — Other Ambulatory Visit: Payer: Self-pay

## 2019-05-22 DIAGNOSIS — K047 Periapical abscess without sinus: Secondary | ICD-10-CM

## 2019-05-22 DIAGNOSIS — M659 Synovitis and tenosynovitis, unspecified: Secondary | ICD-10-CM

## 2019-05-22 MED ORDER — KETOROLAC TROMETHAMINE 30 MG/ML IJ SOLN
INTRAMUSCULAR | Status: AC
  Filled 2019-05-22: qty 1

## 2019-05-22 MED ORDER — PREDNISONE 20 MG PO TABS: 20.0000 mg | ORAL_TABLET | Freq: Every day | ORAL | 0 refills | Status: AC

## 2019-05-22 MED ORDER — IBUPROFEN 600 MG PO TABS: 600.0000 mg | ORAL_TABLET | Freq: Four times a day (QID) | ORAL | 0 refills | Status: DC | PRN

## 2019-05-22 MED ORDER — TRAMADOL HCL 50 MG PO TABS: 50.0000 mg | ORAL_TABLET | Freq: Four times a day (QID) | ORAL | 0 refills | Status: DC | PRN

## 2019-05-22 MED ORDER — PENICILLIN V POTASSIUM 500 MG PO TABS: 500.0000 mg | ORAL_TABLET | Freq: Three times a day (TID) | ORAL | 0 refills | Status: DC

## 2019-05-22 MED ORDER — KETOROLAC TROMETHAMINE 30 MG/ML IJ SOLN
30.0000 mg | Freq: Once | INTRAMUSCULAR | Status: AC
  Administered 2019-05-22: 18:00:00 30 mg via INTRAMUSCULAR

## 2019-05-22 NOTE — ED Provider Notes (Signed)
[facility]    CSN: [id number] Arrival date & time: 05/22/19  1627      History   Chief Complaint Chief Complaint  Patient presents with  . hand swollen    HPI Travis Garza is a 39 y.o. male comes to urgent care with painful swelling of the left hand of 1 day duration.  Patient works as an Designer, television/film set of heavy duty machines.  He denied any trauma or falls.  He noticed the swelling in the left hand yesterday.  This is gotten progressively worse.  Pain is currently severe 10 out of 10.  Pain is throbbing and constant.  Aggravated by movement.  He has not tried any over-the-counter medications.  He denies any numbness or tingling.  No puncture wounds or animal/insect bites.   Patient has difficulty making a fist with the left hand.  Patient also complains of a toothache of 2 days duration.  Second premolar tooth is broken with a retained root.  Pain is constant and throbbing.  No known relieving factors.  Is associated with swelling of the left jaw. HPI  History reviewed. No pertinent past medical history.  There are no problems to display for this patient.   History reviewed. No pertinent surgical history.     Home Medications    Prior to Admission medications   Medication Sig Start Date End Date Taking? Authorizing Provider  ibuprofen (ADVIL) 600 MG tablet Take 1 tablet (600 mg total) by mouth every 6 (six) hours as needed. 05/22/19   Chari Parmenter, Britta Mccreedy, MD  penicillin v potassium (VEETID) 500 MG tablet Take 1 tablet (500 mg total) by mouth 3 (three) times daily. 05/22/19   Yonael Tulloch, Britta Mccreedy, MD  predniSONE (DELTASONE) 20 MG tablet Take 1 tablet (20 mg total) by mouth daily for 5 days. 05/22/19 05/27/19  LampteyBritta Mccreedy, MD  traMADol (ULTRAM) 50 MG tablet Take 1 tablet (50 mg total) by mouth every 6 (six) hours as needed. 05/22/19   Marquavius Scaife, Britta Mccreedy, MD  ipratropium (ATROVENT) 0.06 % nasal spray Place 2 sprays into both nostrils 4 (four) times daily. Patient not taking:  Reported on 07/29/2018 10/16/16 07/29/18  Deatra Canter, FNP    Family History Family History  Problem Relation Age of Onset  . Healthy Mother   . Healthy Father     Social History Social History   Tobacco Use  . Smoking status: Current Every Day Smoker    Packs/day: 1.00    Types: Cigarettes  . Smokeless tobacco: Never Used  Substance Use Topics  . Alcohol use: Yes    Comment: occ  . Drug use: Never     Allergies   Patient has no known allergies.   Review of Systems Review of Systems  Constitutional: Positive for activity change. Negative for chills, fatigue and fever.  HENT: Positive for dental problem and facial swelling. Negative for sinus pressure and sinus pain.   Gastrointestinal: Negative for abdominal pain, diarrhea, nausea and vomiting.  Musculoskeletal: Positive for arthralgias, joint swelling and myalgias.  Skin: Positive for color change. Negative for rash and wound.  Neurological: Negative.   Psychiatric/Behavioral: Negative for confusion and decreased concentration.     Physical Exam Triage Vital Signs ED Triage Vitals  Enc Vitals Group     BP 05/22/19 1647 (!) 134/94     Pulse Rate 05/22/19 1647 94     Resp 05/22/19 1647 16     Temp 05/22/19 1647 98.9 F (37.2 C)  Temp Source 05/22/19 1647 Oral     SpO2 05/22/19 1647 99 %     Weight 05/22/19 1648 225 lb (102.1 kg)     Height 05/22/19 1648 6\' 1"  (1.854 m)     Head Circumference --      Peak Flow --      Pain Score 05/22/19 1648 7     Pain Loc --      Pain Edu? --      Excl. in [place]? --    No data found.  Updated Vital Signs BP (!) 134/94   Pulse 94   Temp 98.9 F (37.2 C) (Oral)   Resp 16   Ht 6\' 1"  (1.854 m)   Wt 102.1 kg   SpO2 99%   BMI 29.69 kg/m   Visual Acuity Right Eye Distance:   Left Eye Distance:   Bilateral Distance:    Right Eye Near:   Left Eye Near:    Bilateral Near:     Physical Exam Vitals and nursing note reviewed.  Constitutional:      General:  He is in acute distress.     Appearance: He is ill-appearing.  HENT:     Right Ear: Tympanic membrane normal.     Left Ear: Tympanic membrane normal.  Cardiovascular:     Pulses: Normal pulses.     Heart sounds: Normal heart sounds.  Pulmonary:     Effort: Pulmonary effort is normal.     Breath sounds: Normal breath sounds.  Abdominal:     General: Bowel sounds are normal.     Palpations: Abdomen is soft.  Musculoskeletal:        General: Swelling and tenderness present. No deformity or signs of injury.     Right lower leg: No edema.     Left lower leg: No edema.     Comments: Tender swelling of the left hand.  Pain is aggravated by extending the fingers.  He has tenderness over the dorsum of the left hand.  Mild erythema.  Skin:    General: Skin is warm.     Capillary Refill: Capillary refill takes less than 2 seconds.  Neurological:     General: No focal deficit present.     Mental Status: He is alert.      UC Treatments / Results  Labs (all labs ordered are listed, but only abnormal results are displayed) Labs Reviewed - No data to display  EKG   Radiology No results found.  Procedures Procedures (including critical care time)  Medications Ordered in UC Medications  ketorolac (TORADOL) 30 MG/ML injection 30 mg (30 mg Intramuscular Given 05/22/19 1827)    Initial Impression / Assessment and Plan / UC Course  I have reviewed the triage vital signs and the nursing notes.  Pertinent labs & imaging results that were available during my care of the patient were reviewed by me and considered in my medical decision making (see chart for details).     1.  Severe tenosynovitis of the left hand: Prednisone 20 mg orally daily for 5 days Tramadol 50 mg every 6 hours as needed for pain Ibuprofen 600 mg every 6 hours as needed for pain Ace wrap of the left hand If symptoms worsens, hand surgeon consultation will be appropriate  2.  Dental infection: Penicillin V 500  mg 3 times daily for 10 days Pain medications as above Return precautions given Patient will need dental appointment in the near future.  Final Clinical Impressions(s) /  UC Diagnoses   Final diagnoses:  Tenosynovitis of left hand  Dental infection   Discharge Instructions   None    ED Prescriptions    Medication Sig Dispense Auth. Provider   penicillin v potassium (VEETID) 500 MG tablet Take 1 tablet (500 mg total) by mouth 3 (three) times daily. 30 tablet Brailee Riede, Myrene Galas, MD   predniSONE (DELTASONE) 20 MG tablet Take 1 tablet (20 mg total) by mouth daily for 5 days. 5 tablet Donaciano Range, Myrene Galas, MD   traMADol (ULTRAM) 50 MG tablet Take 1 tablet (50 mg total) by mouth every 6 (six) hours as needed. 15 tablet Carroll Ranney, Myrene Galas, MD   ibuprofen (ADVIL) 600 MG tablet Take 1 tablet (600 mg total) by mouth every 6 (six) hours as needed. 30 tablet Dekisha Mesmer, Myrene Galas, MD     I have reviewed the PDMP during this encounter.   Chase Picket, MD 05/22/19 [phone]

## 2019-05-22 NOTE — ED Triage Notes (Signed)
Pt states he noticed last night his left hand was swollen. Pt denies injury. Pt has 2+ edema of posterior of left hand. Pt c/o 7/10 sharp pain in left hand. Pt has 2+ left radial pulse, cap refill less than 3 sec, 2/5 left hand grip, extremity warm to touch. Pt states he has a tooth ache on the left bottom of his mouthx2 days.

## 2019-08-16 ENCOUNTER — Other Ambulatory Visit: Payer: Self-pay

## 2019-08-16 ENCOUNTER — Ambulatory Visit (HOSPITAL_COMMUNITY)
Admission: EM | Admit: 2019-08-16 | Discharge: 2019-08-16 | Disposition: A | Discharge status: Home / Self Care | Payer: Self-pay | Attending: Family Medicine | Admitting: Family Medicine

## 2019-08-16 DIAGNOSIS — G44209 Tension-type headache, unspecified, not intractable: Secondary | ICD-10-CM

## 2019-08-16 NOTE — Discharge Instructions (Addendum)
Please stay well hydrated  Please take breaks often in the heat  Please place cold towels around your neck Please follow up if your symptoms fail to improve.

## 2019-08-16 NOTE — ED Triage Notes (Signed)
Pt c/o headache starting today after working in the heat. Did not take any OTC meds.

## 2019-08-16 NOTE — ED Provider Notes (Signed)
[facility]    CSN: [id number] Arrival date & time: 08/16/19  1607      History   Chief Complaint Chief Complaint  Patient presents with   Headache    HPI CEDERICK BROADNAX is a 39 y.o. male.   He is presenting with a headache that started earlier today.  He has been working at a Education officer, community.  He started having a posterior headache.  He felt unlike his self.  It has been hot outside and he did not take very many breaks today.  Denies any loss consciousness.  No history of similar symptoms.  HPI  No past medical history on file.  There are no problems to display for this patient.   No past surgical history on file.     Home Medications    Prior to Admission medications   Medication Sig Start Date End Date Taking? Authorizing Provider  ipratropium (ATROVENT) 0.06 % nasal spray Place 2 sprays into both nostrils 4 (four) times daily. Patient not taking: Reported on 07/29/2018 10/16/16 07/29/18  Deatra Canter, FNP    Family History Family History  Problem Relation Age of Onset   Healthy Mother    Healthy Father     Social History Social History   Tobacco Use   Smoking status: Current Every Day Smoker    Packs/day: 1.00    Types: Cigarettes   Smokeless tobacco: Never Used  Vaping Use   Vaping Use: Never used  Substance Use Topics   Alcohol use: Yes    Comment: occ   Drug use: Never     Allergies   Patient has no known allergies.   Review of Systems Review of Systems  See HPI  Physical Exam Triage Vital Signs ED Triage Vitals [08/16/19 1724]  Enc Vitals Group     BP 117/79     Pulse Rate 76     Resp 16     Temp 98 F (36.7 C)     Temp src      SpO2 97 %     Weight      Height      Head Circumference      Peak Flow      Pain Score 2     Pain Loc      Pain Edu?      Excl. in GC?    No data found.  Updated Vital Signs BP 117/79    Pulse 76    Temp 98 F (36.7 C)    Resp 16    SpO2 97%   Visual  Acuity Right Eye Distance:   Left Eye Distance:   Bilateral Distance:    Right Eye Near:   Left Eye Near:    Bilateral Near:     Physical Exam Gen: NAD, alert, cooperative with exam, well-appearing ENT: normal lips, normal nasal mucosa,  Eye: normal EOM, normal conjunctiva and lids Skin: no rashes, no areas of induration  Neuro: normal tone, normal sensation to touch Psych:  normal insight, alert and oriented MSK:  Normal neck range of motion. Neurovascularly intact   UC Treatments / Results  Labs (all labs ordered are listed, but only abnormal results are displayed) Labs Reviewed - No data to display  EKG   Radiology No results found.  Procedures Procedures (including critical care time)  Medications Ordered in UC Medications - No data to display  Initial Impression / Assessment and Plan / UC Course  I have reviewed  the triage vital signs and the nursing notes.  Pertinent labs & imaging results that were available during my care of the patient were reviewed by me and considered in my medical decision making (see chart for details).     Mr. Seeley is a 39 year old male is presenting with a headache.  Likely secondary to heat exhaustion.  No neurological symptoms and feels better since being in the clinic.  Counseled on signs of heat illness.  Given indications on taking breaks and icing.  Given indications to follow-up.  Provided work note   Final Clinical Impressions(s) / UC Diagnoses   Final diagnoses:  Acute non intractable tension-type headache     Discharge Instructions     Please stay well hydrated  Please take breaks often in the heat  Please place cold towels around your neck Please follow up if your symptoms fail to improve.     ED Prescriptions    None     PDMP not reviewed this encounter.   Myra Rude, MD 08/16/19 [id number]

## 2019-09-21 ENCOUNTER — Ambulatory Visit (HOSPITAL_COMMUNITY)
Admission: EM | Admit: 2019-09-21 | Discharge: 2019-09-21 | Disposition: A | Discharge status: Home / Self Care | Payer: Self-pay | Attending: Family Medicine | Admitting: Family Medicine

## 2019-09-21 ENCOUNTER — Encounter (HOSPITAL_COMMUNITY): Payer: Self-pay | Admitting: Emergency Medicine

## 2019-09-21 ENCOUNTER — Other Ambulatory Visit: Payer: Self-pay

## 2019-09-21 DIAGNOSIS — K1120 Sialoadenitis, unspecified: Secondary | ICD-10-CM

## 2019-09-21 MED ORDER — CLINDAMYCIN HCL 300 MG PO CAPS: 300.0000 mg | ORAL_CAPSULE | Freq: Three times a day (TID) | ORAL | 0 refills | Status: AC

## 2019-09-21 MED ORDER — IBUPROFEN 800 MG PO TABS: 800.0000 mg | ORAL_TABLET | Freq: Three times a day (TID) | ORAL | 0 refills | Status: AC

## 2019-09-21 NOTE — ED Triage Notes (Addendum)
Pt c/o left bottom back tooth pain onset 4 days ago. Pt states it is keeping him up at night and he is having headaches.

## 2019-09-26 NOTE — ED Provider Notes (Signed)
[facility]   [id number] 09/21/19 Arrival Time: 1946  ASSESSMENT & PLAN:  1. Parotitis     No sign of abscess requiring I&D at this time. Discussed.  Begin: Meds ordered this encounter  Medications  . clindamycin (CLEOCIN) 300 MG capsule    Sig: Take 1 capsule (300 mg total) by mouth 3 (three) times daily.    Dispense:  30 capsule    Refill:  0  . ibuprofen (ADVIL) 800 MG tablet    Sig: Take 1 tablet (800 mg total) by mouth 3 (three) times daily with meals.    Dispense:  21 tablet    Refill:  0     Follow-up Information    [facility].   Specialty: Urgent Care Why: If worsening or failing to improve as anticipated. Contact information: [address] [place] [address] [phone]               Reviewed expectations re: course of current medical issues. Questions answered. Outlined signs and symptoms indicating need for more acute intervention. Patient verbalized understanding. After Visit Summary given.   SUBJECTIVE:  Travis Garza is a 39 y.o. male who reports swelling of L side of face; gradual onset approx 3-4 d ago. Is getting more painful. Afebrile. Tolerating PO intake without n/v. Mild headache. No OTC tx. No neck pain or swelling.   OBJECTIVE: Vitals:   09/21/19 2009  BP: 126/82  Pulse: 81  Resp: 19  Temp: 99.1 F (37.3 C)  TempSrc: Oral  SpO2: 100%    General appearance: alert; no distress HENT: normocephalic; atraumatic; swelling and TTP over L parotid gland; gums without fluctuance; no overlying skin erythema; normal jaw movement Neck: supple without LAD; FROM; trachea midline Lungs: normal respirations; unlabored; speaks full sentences without difficulty Skin: warm and dry Psychological: alert and cooperative; normal mood and affect  No Known Allergies  History reviewed. No pertinent past medical history. Social History   Socioeconomic History  . Marital status: Married     Spouse name: Not on file  . Number of children: Not on file  . Years of education: Not on file  . Highest education level: Not on file  Occupational History  . Not on file  Tobacco Use  . Smoking status: Current Every Day Smoker    Packs/day: 1.00    Types: Cigarettes  . Smokeless tobacco: Never Used  Vaping Use  . Vaping Use: Never used  Substance and Sexual Activity  . Alcohol use: Yes    Comment: occ  . Drug use: Never  . Sexual activity: Not Currently  Other Topics Concern  . Not on file  Social History Narrative  . Not on file   Social Determinants of Health   Financial Resource Strain:   . Difficulty of Paying Living Expenses:   Food Insecurity:   . Worried About Programme researcher, broadcasting/film/video in the Last Year:   . Barista in the Last Year:   Transportation Needs:   . Freight forwarder (Medical):   Marland Kitchen Lack of Transportation (Non-Medical):   Physical Activity:   . Days of Exercise per Week:   . Minutes of Exercise per Session:   Stress:   . Feeling of Stress :   Social Connections:   . Frequency of Communication with Friends and Family:   . Frequency of Social Gatherings with Friends and Family:   . Attends Religious Services:   . Active Member  of Clubs or Organizations:   . Attends Banker Meetings:   Marland Kitchen Marital Status:   Intimate Partner Violence:   . Fear of Current or Ex-Partner:   . Emotionally Abused:   Marland Kitchen Physically Abused:   . Sexually Abused:    Family History  Problem Relation Age of Onset  . Healthy Mother   . Healthy Father    History reviewed. No pertinent surgical history.   Mardella Layman, MD 09/26/19 1426

## 2019-12-24 ENCOUNTER — Other Ambulatory Visit: Payer: Self-pay

## 2019-12-24 ENCOUNTER — Ambulatory Visit (HOSPITAL_COMMUNITY)
Admission: EM | Admit: 2019-12-24 | Discharge: 2019-12-24 | Disposition: A | Discharge status: Home / Self Care | Payer: Self-pay | Attending: Urgent Care | Admitting: Urgent Care

## 2019-12-24 ENCOUNTER — Encounter (HOSPITAL_COMMUNITY): Payer: Self-pay

## 2019-12-24 DIAGNOSIS — R0981 Nasal congestion: Secondary | ICD-10-CM

## 2019-12-24 DIAGNOSIS — J069 Acute upper respiratory infection, unspecified: Secondary | ICD-10-CM

## 2019-12-24 MED ORDER — PROMETHAZINE-DM 6.25-15 MG/5ML PO SYRP: 5.0000 mL | ORAL_SOLUTION | Freq: Every evening | ORAL | 0 refills | Status: AC | PRN

## 2019-12-24 MED ORDER — PSEUDOEPHEDRINE HCL 60 MG PO TABS: 60.0000 mg | ORAL_TABLET | Freq: Three times a day (TID) | ORAL | 0 refills | Status: AC | PRN

## 2019-12-24 MED ORDER — BENZONATATE 100 MG PO CAPS: 100.0000 mg | ORAL_CAPSULE | Freq: Three times a day (TID) | ORAL | 0 refills | Status: AC | PRN

## 2019-12-24 MED ORDER — CETIRIZINE HCL 10 MG PO TABS: 10.0000 mg | ORAL_TABLET | Freq: Every day | ORAL | 0 refills | Status: AC

## 2019-12-24 NOTE — Discharge Instructions (Addendum)

## 2019-12-24 NOTE — ED Provider Notes (Signed)
  [facility]   MRN: [id number] DOB: May 25, 1980  Subjective:   Travis Garza is a 39 y.o. male presenting for 4-day history of acute onset productive cough, throat pain, runny and stuffy nose, subjective fever, chills.  Patient states that he had an episode of vomiting just once.  Has lost sense of taste and smell.  Denies chest pain, shortness of breath, body aches.  Does not have Covid vaccination.  He is a smoker.  He is not currently taking any medications and has no known food or drug allergies.  Denies past medical and surgical history.   Family History  Problem Relation Age of Onset  . Healthy Mother   . Healthy Father     Social History   Tobacco Use  . Smoking status: Current Every Day Smoker    Packs/day: 1.00    Types: Cigarettes  . Smokeless tobacco: Never Used  Vaping Use  . Vaping Use: Never used  Substance Use Topics  . Alcohol use: Yes    Comment: occ  . Drug use: Never    ROS   Objective:   Vitals: BP 116/74 (BP Location: Right Arm)   Pulse 99   Temp 99.1 F (37.3 C) (Oral)   Resp 17   SpO2 97%   Physical Exam Constitutional:      General: He is not in acute distress.    Appearance: Normal appearance. He is well-developed and normal weight. He is not ill-appearing, toxic-appearing or diaphoretic.  HENT:     Head: Normocephalic and atraumatic.     Right Ear: External ear normal.     Left Ear: External ear normal.     Nose: Nose normal.     Mouth/Throat:     Pharynx: Oropharynx is clear.  Eyes:     General: No scleral icterus.       Right eye: No discharge.        Left eye: No discharge.     Extraocular Movements: Extraocular movements intact.     Pupils: Pupils are equal, round, and reactive to light.  Cardiovascular:     Rate and Rhythm: Normal rate.  Pulmonary:     Effort: Pulmonary effort is normal.     Comments: Patient is in no respiratory distress, speaking in full sentences and no use of accessory muscles,  cyanotic lips or pursed lips. Musculoskeletal:     Cervical back: Normal range of motion.  Neurological:     Mental Status: He is alert and oriented to person, place, and time.  Psychiatric:        Mood and Affect: Mood normal.        Behavior: Behavior normal.        Thought Content: Thought content normal.        Judgment: Judgment normal.      Assessment and Plan :   PDMP not reviewed this encounter.  1. Viral URI with cough   2. Nasal congestion     Will manage for viral illness such as viral URI, viral syndrome, viral rhinitis, high suspicion for COVID-19. Counseled patient on nature of COVID-19 including modes of transmission, diagnostic testing, management and supportive care.  Offered scripts for symptomatic relief. COVID 19 testing is pending. Counseled patient on potential for adverse effects with medications prescribed/recommended today, ER and return-to-clinic precautions discussed, patient verbalized understanding.     Wallis Bamberg, [place] 12/24/19 1208

## 2019-12-24 NOTE — ED Triage Notes (Addendum)
Pt c/o productive cough with green sputum, sore throat, congestion, runny nose, subjective fever, chills for approx 4 days. Vomiting on Wednesday, resolved since. Also reports loss of smell.   Denies diarrhea, SOB, body aches.  Took Nyquil Wednesday night with some improvement of symptoms. Has not taken COVID vaccines.

## 2019-12-25 IMAGING — CR DG SHOULDER 2+V*R*
3 series · 3 of 3 positions shown · non-contrast
Comparison: None.

CLINICAL DATA: Sharp right shoulder pain with painful range of
motion.

EXAM:
RIGHT SHOULDER - 2+ VIEW

[shoulder grashey]
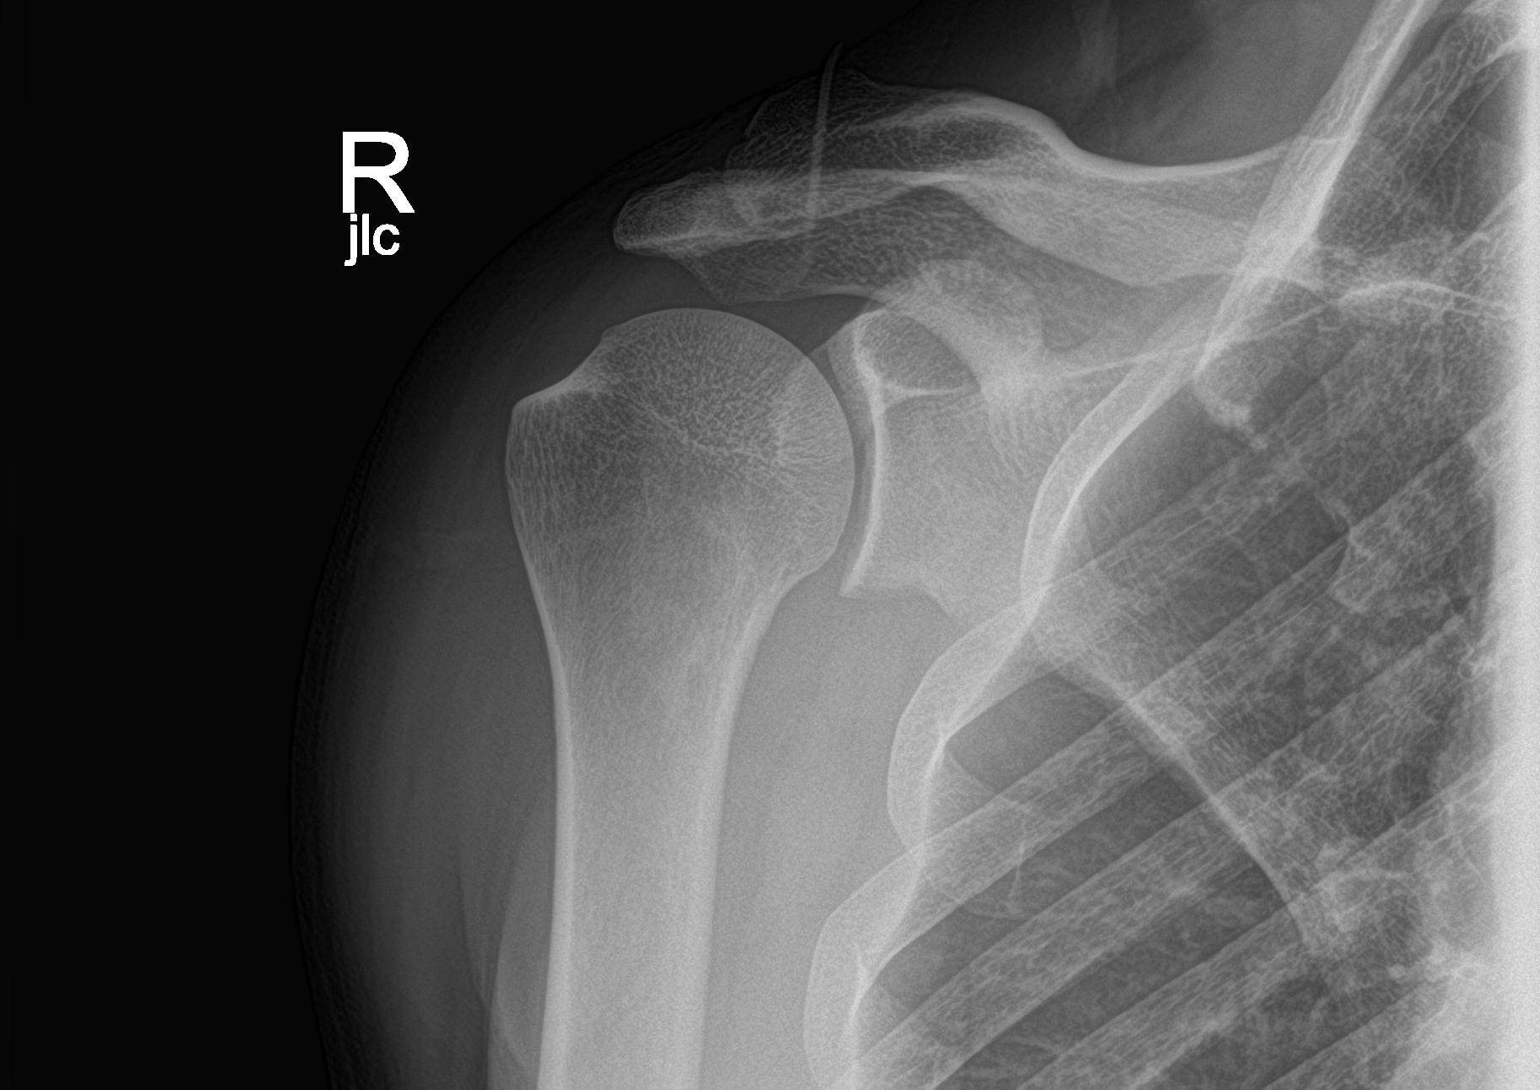

[shoulder y view]
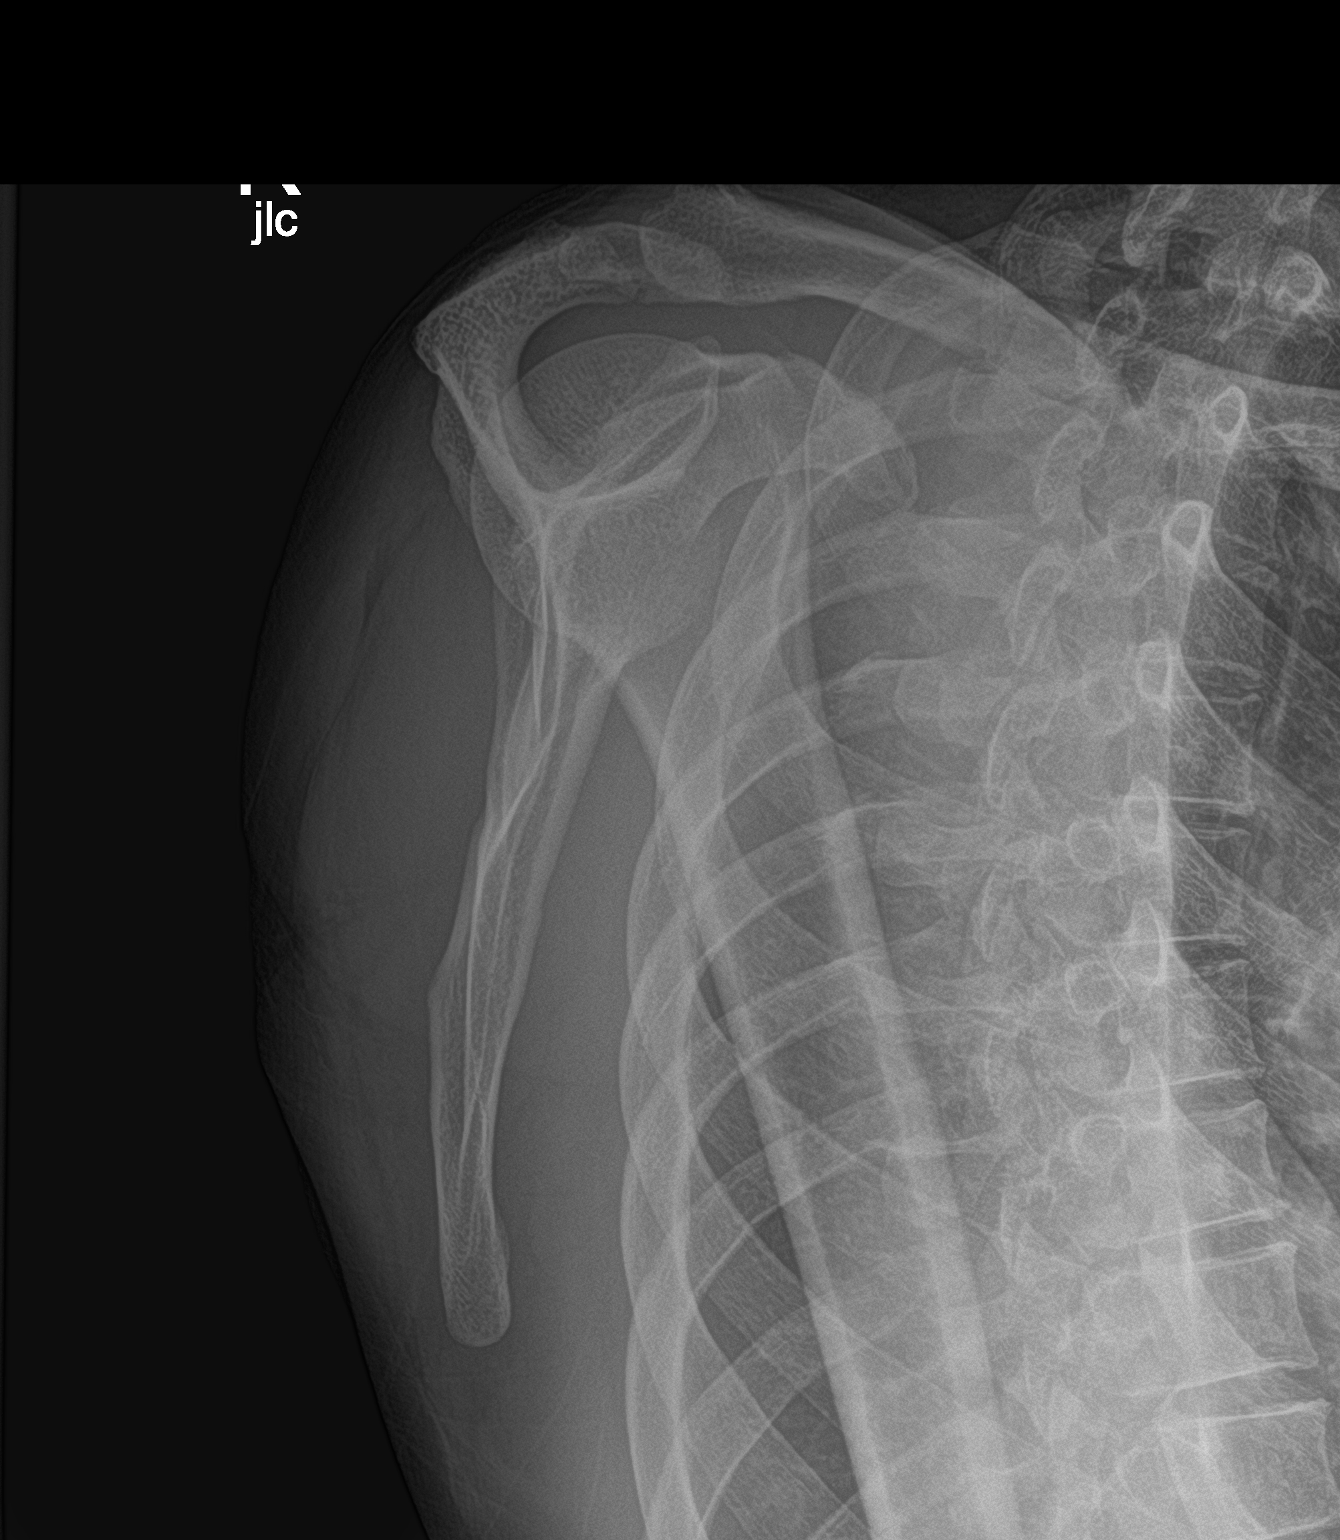

[shoulder axillary]
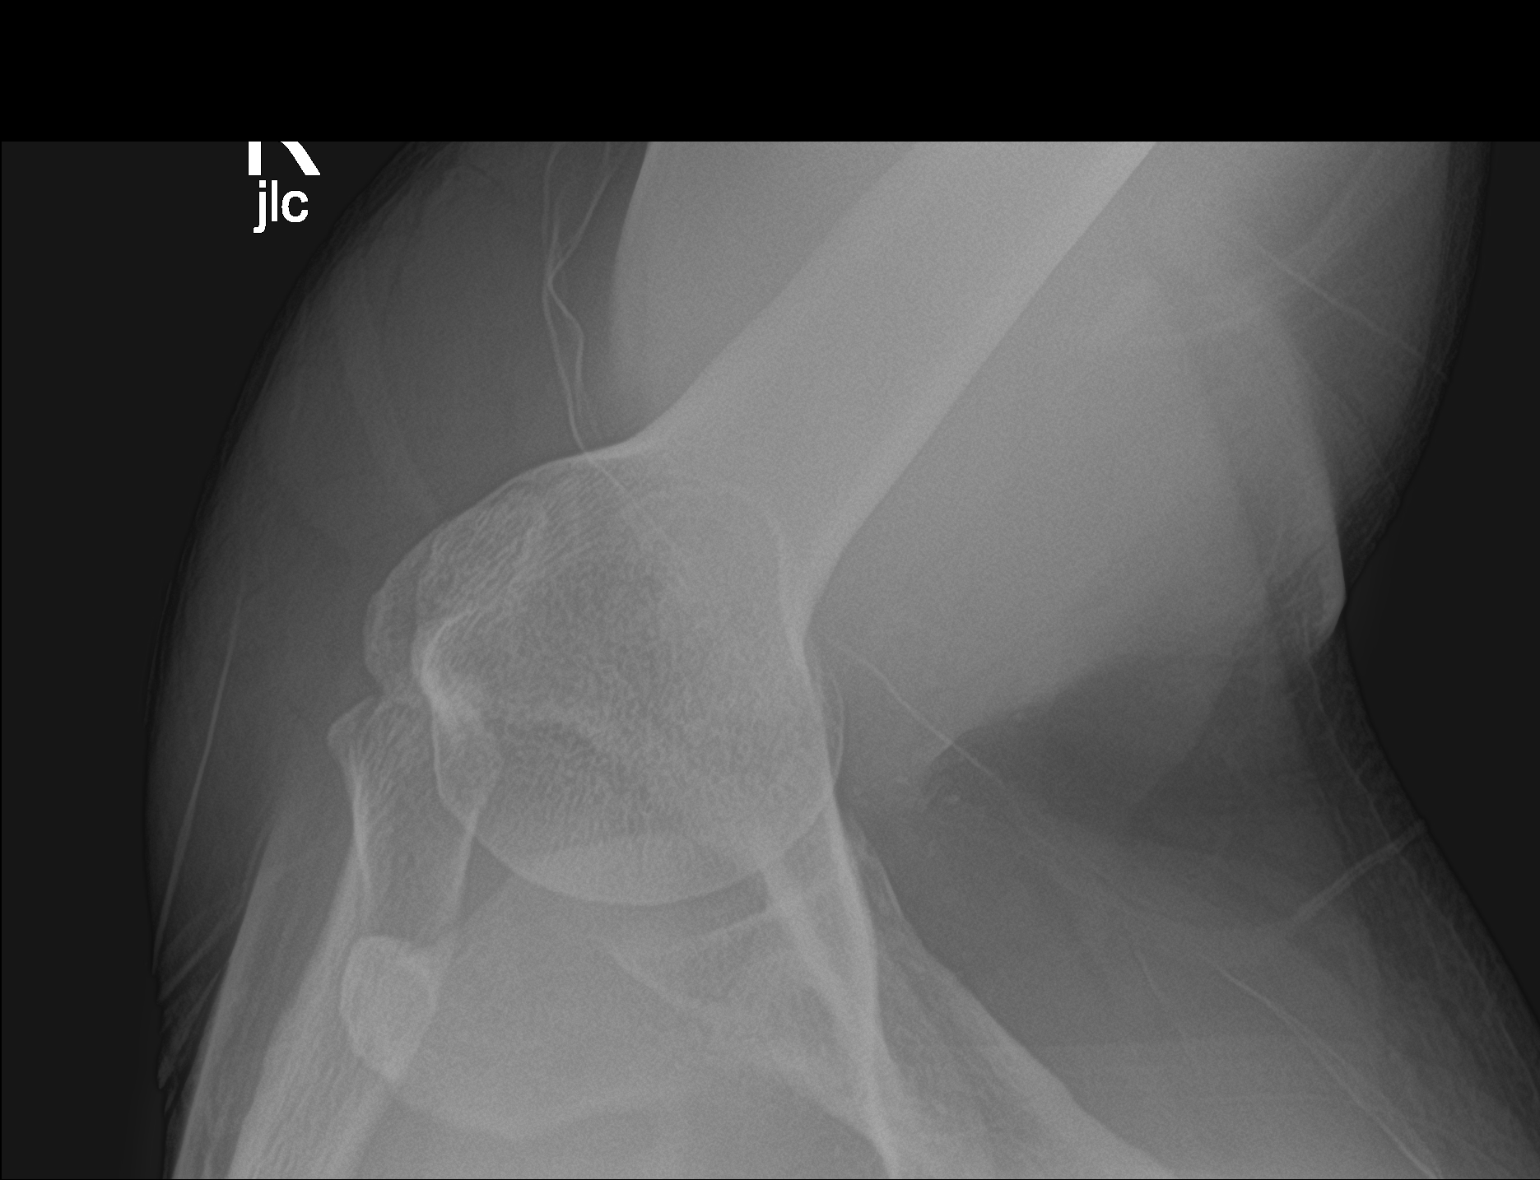

[3 of 3 positions shown; findings below may reference images not displayed]

FINDINGS: There is no evidence of fracture or dislocation. There is no
evidence of arthropathy or other focal bone abnormality. Soft
tissues are unremarkable.
IMPRESSION: Negative.
